# Patient Record
Sex: Male | Born: 1935 | Race: White | Hispanic: No | Marital: Single
Health system: Southern US, Community
[De-identification: ages and names within clinical notes are randomized; demographics above are authoritative.]

---

## 2008-11-23 IMAGING — CT CT CHEST W/ CM
1 series · 15 of 32 positions shown, 19 images · non-contrast
Comparison: none

REASON FOR EXAM: Weight loss, abnormal chest x-ray
COMMENTS:

[Series 2: soft tissue · axial · 0.77mm/px · z∈[-728,-438]mm · 15 of 66 slices shown, 19 images]
[im 5/66  soft-tissue]
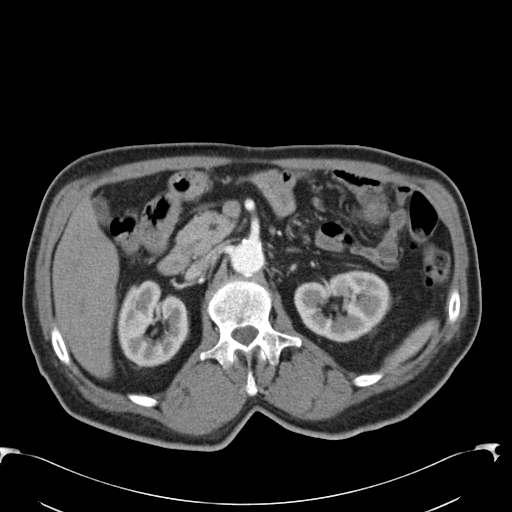
[im 5/66  bone]
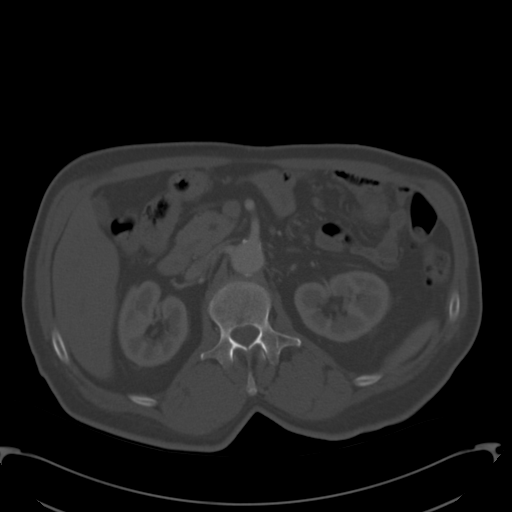
[im 9/66  soft-tissue]
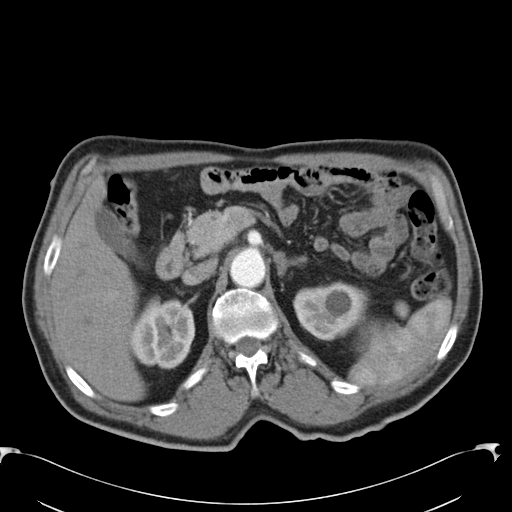
[im 13/66  soft-tissue]
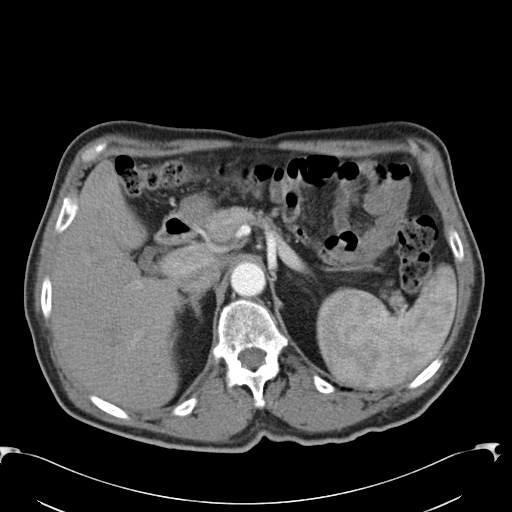
[im 19/66  soft-tissue]
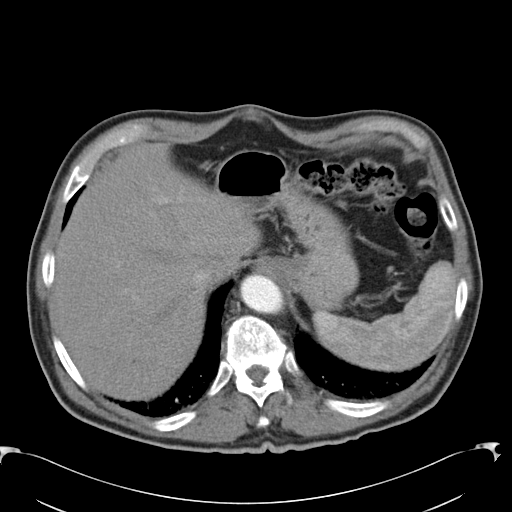
[im 24/66  soft-tissue]
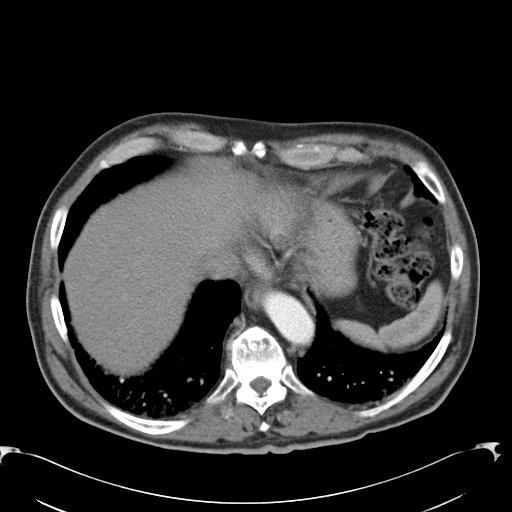
[im 28/66  soft-tissue]
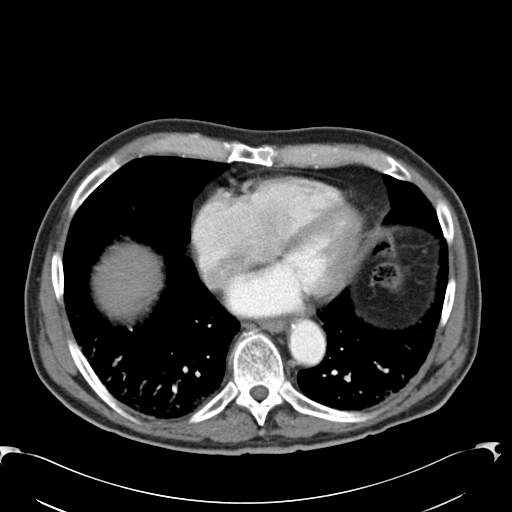
[im 34/66  soft-tissue]
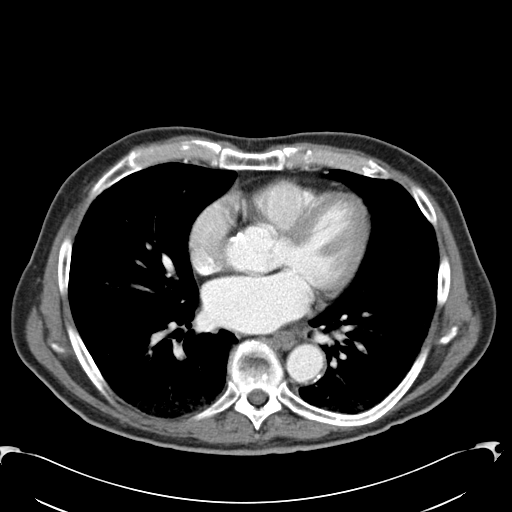
[im 38/66  soft-tissue]
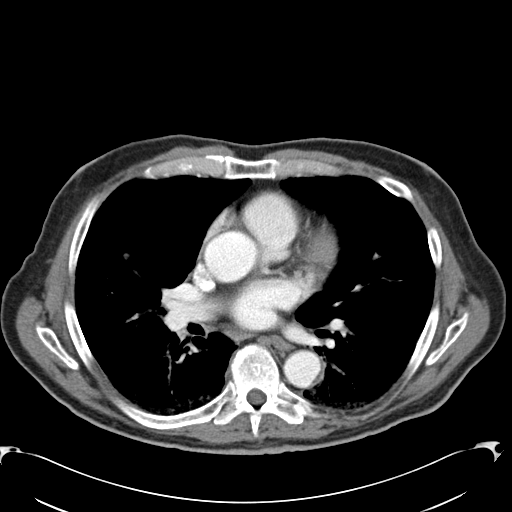
[im 42/66  soft-tissue]
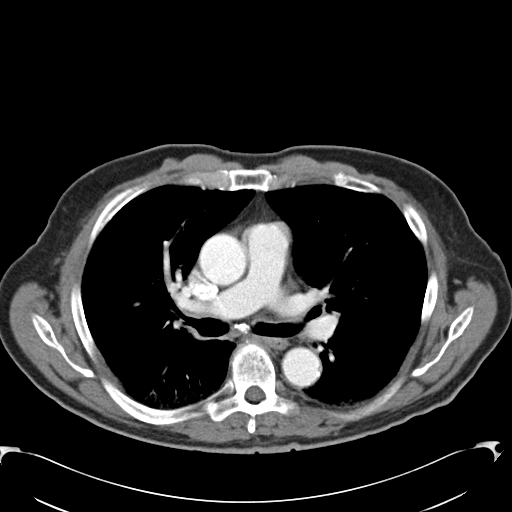
[im 42/66  bone]
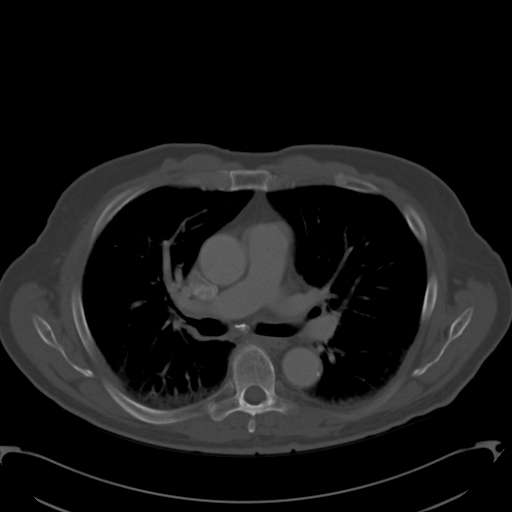
[im 47/66  soft-tissue]
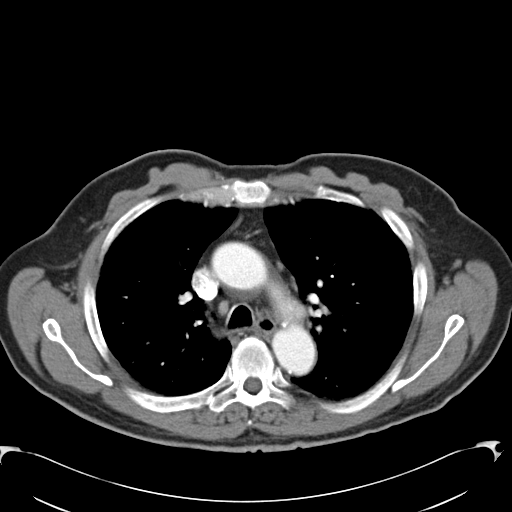
[im 53/66  soft-tissue]
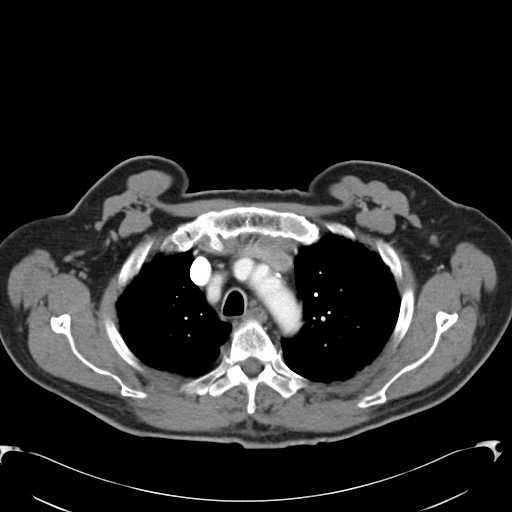
[im 57/66  soft-tissue]
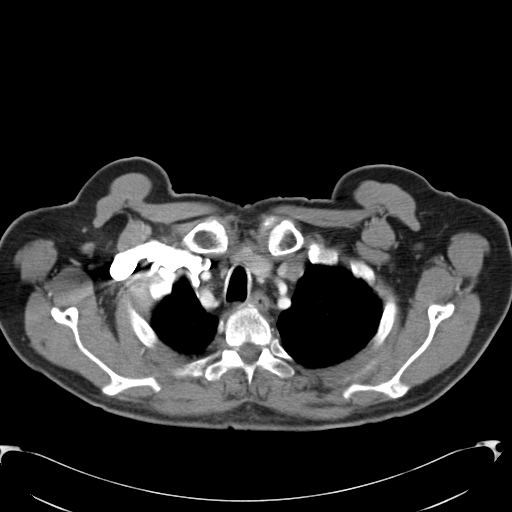
[im 57/66  lung]
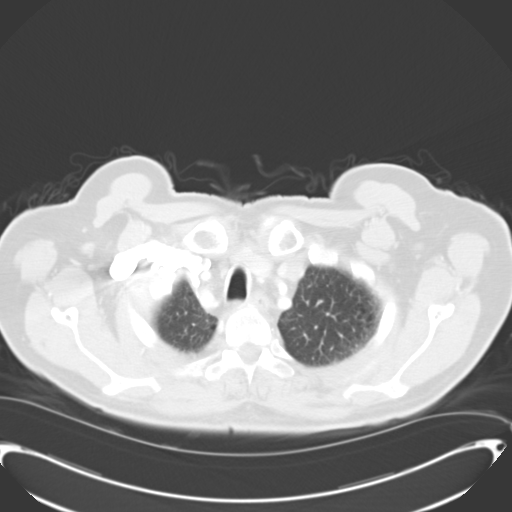
[im 59/66  lung]
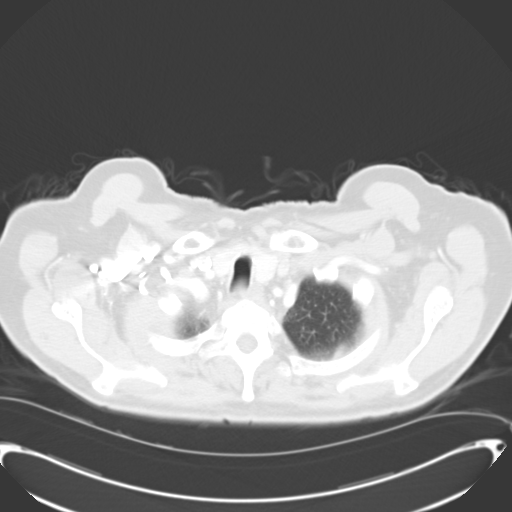
[im 61/66  soft-tissue]
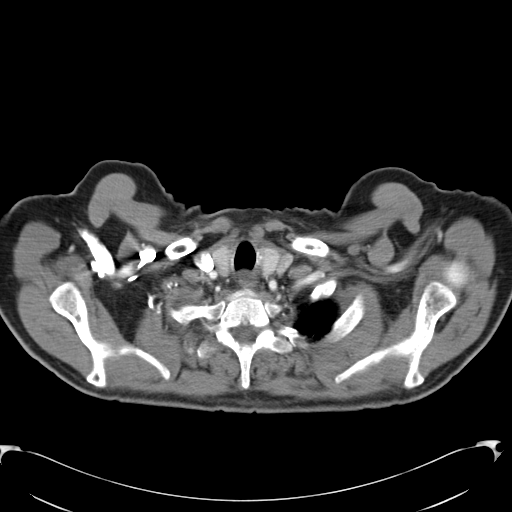
[im 61/66  lung]
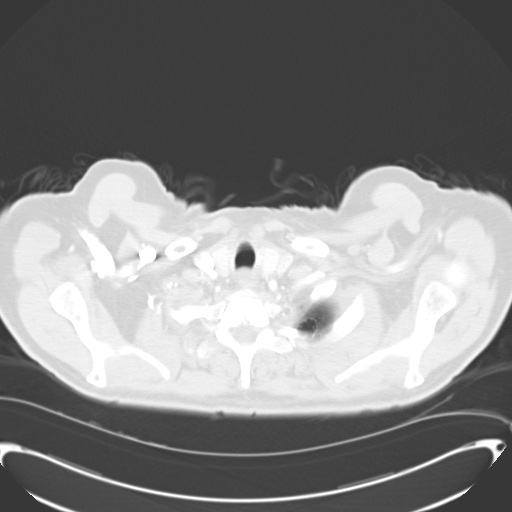
[im 63/66  lung]
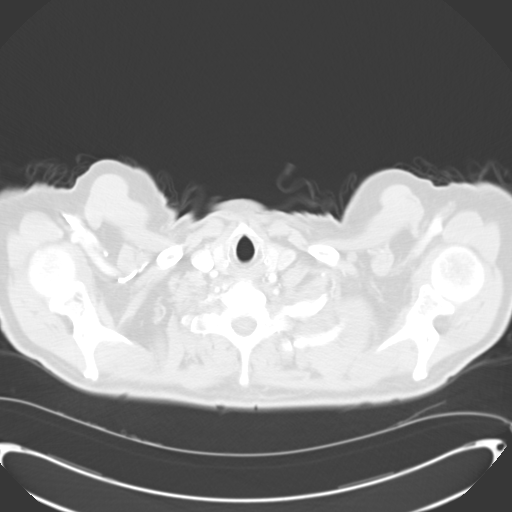

[15 of 32 positions shown; findings below may reference images not displayed]

PROCEDURE:     CT  - CT CHEST WITH CONTRAST  - June 14, 2007  [DATE]

RESULT:     The patient received 75 ml of Rsovue-21I for this study.

The cardiac chambers are normal in size. The caliber of the thoracic aorta
is normal. The central pulmonary arteries are normal in appearance. I see no
pathologic sized mediastinal or hilar lymph nodes. The retrosternal soft
tissues are normal in appearance. At lung window settings, the interstitial
markings are increased diffusely, especially in the upper lobes and in the
dependent portion of the lower lobes. I see no pulmonary parenchymal masses.
There is a 1.0 cm nodule demonstrated on image #29 in the inferior aspect of
the RIGHT upper lobe. It is noncalcified. There is a calcified nodule in the
RIGHT lower lobe posteriorly. A calcified nodule is noted more superiorly in
the RIGHT lower lobe on image #26. There are calcified, subcarinal lymph
nodes which are not enlarged. Within the upper abdomen, the observed
portions of the liver and spleen are normal. The gallbladder exhibits no
calcified stones. I see no adrenal masses. There is hypo-density in the
upper pole of the LEFT kidney which measures approximately 2.2 cm in
diameter and has Hounsfield measurement of 5.
IMPRESSION: 1.  There is an approximately 1.0 cm in diameter nodule inferiorly in the
RIGHT upper lobe on image #29. This is nonspecific and will merit follow-up
CT scanning.
2.  There are calcified nodules elsewhere in the RIGHT lower lobe consistent
with prior granulomatous infection. In addition, calcified lymph nodes in
the subcarinal region are present.
2.  There are findings consistent with COPD with an element of pulmonary
fibrosis.
3.  There is no overt evidence of CHF. The caliber of the thoracic aorta is
normal.
4.  The patient reportedly has an abnormal chest x-ray, but I do not have
the film available to me. If the dictation above does not address the
finding mentioned on the chest x-ray, I will gladly dictate an addendum.

## 2008-12-03 ENCOUNTER — Emergency Department: Payer: Self-pay | Admitting: Internal Medicine

## 2009-03-21 ENCOUNTER — Encounter: Payer: Self-pay | Admitting: Internal Medicine

## 2009-03-25 ENCOUNTER — Encounter: Payer: Self-pay | Admitting: Internal Medicine

## 2009-03-28 ENCOUNTER — Emergency Department: Payer: Self-pay | Admitting: Emergency Medicine

## 2009-04-24 ENCOUNTER — Encounter: Payer: Self-pay | Admitting: Internal Medicine

## 2009-05-25 ENCOUNTER — Encounter: Payer: Self-pay | Admitting: Internal Medicine

## 2009-06-25 ENCOUNTER — Encounter: Payer: Self-pay | Admitting: Internal Medicine

## 2009-07-25 ENCOUNTER — Encounter: Payer: Self-pay | Admitting: Internal Medicine

## 2009-08-25 ENCOUNTER — Encounter: Payer: Self-pay | Admitting: Internal Medicine

## 2009-09-24 ENCOUNTER — Encounter: Payer: Self-pay | Admitting: Internal Medicine

## 2009-10-25 ENCOUNTER — Encounter: Payer: Self-pay | Admitting: Internal Medicine

## 2009-11-25 ENCOUNTER — Encounter: Payer: Self-pay | Admitting: Internal Medicine

## 2009-12-23 ENCOUNTER — Encounter: Payer: Self-pay | Admitting: Internal Medicine

## 2010-01-23 ENCOUNTER — Encounter: Payer: Self-pay | Admitting: Internal Medicine

## 2010-02-22 ENCOUNTER — Encounter: Payer: Self-pay | Admitting: Internal Medicine

## 2010-03-25 ENCOUNTER — Encounter: Payer: Self-pay | Admitting: Internal Medicine

## 2010-04-24 ENCOUNTER — Encounter: Payer: Self-pay | Admitting: Internal Medicine

## 2010-05-15 IMAGING — CT CT HEAD WITHOUT CONTRAST
2 series · 16 of 30 positions shown, 20 images · non-contrast
Comparison: none

REASON FOR EXAM: weak
COMMENTS:

[Series 2: without · axial · non-contrast · 0.39mm/px · z∈[+695,+825]mm · 13 of 32 slices shown, 17 images]
[im 3/32  brain]
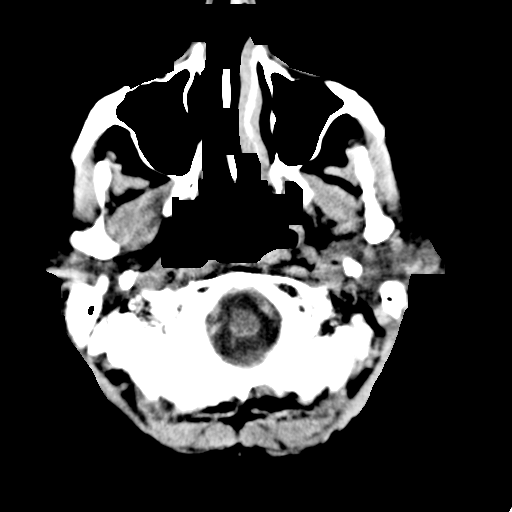
[im 3/32  bone]
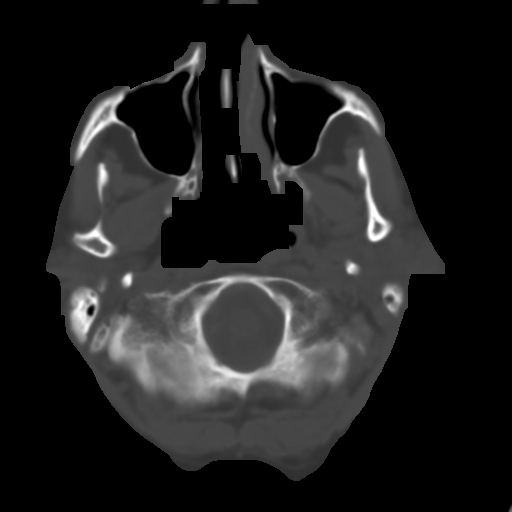
[im 5/32  brain]
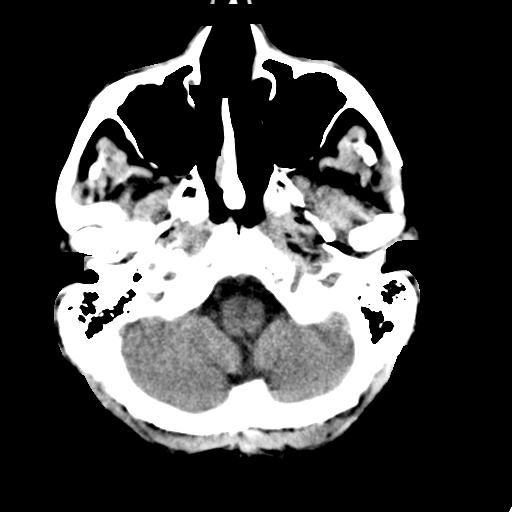
[im 7/32  brain]
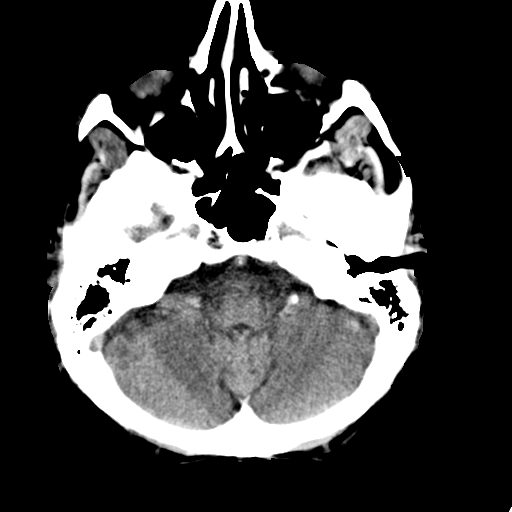
[im 9/32  brain]
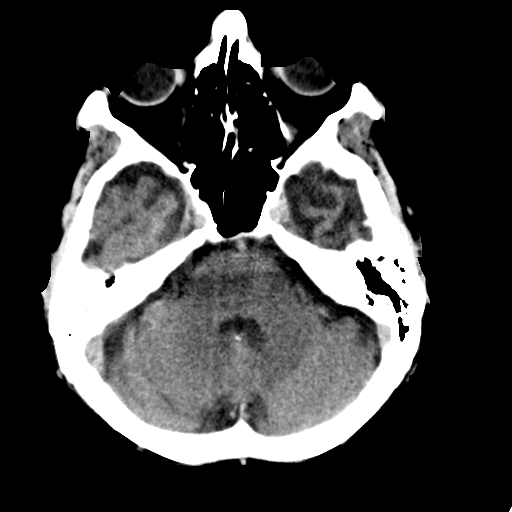
[im 12/32  brain]
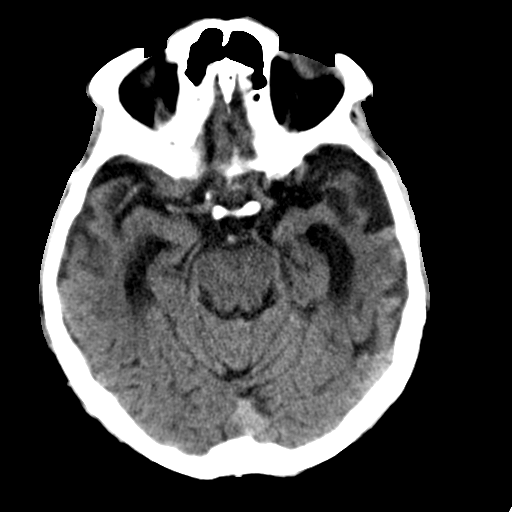
[im 12/32  bone]
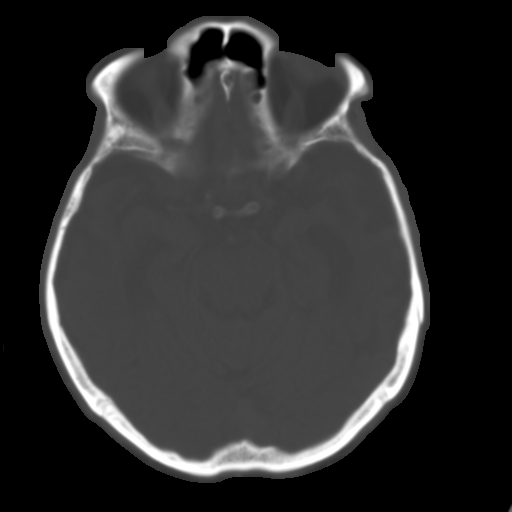
[im 14/32  brain]
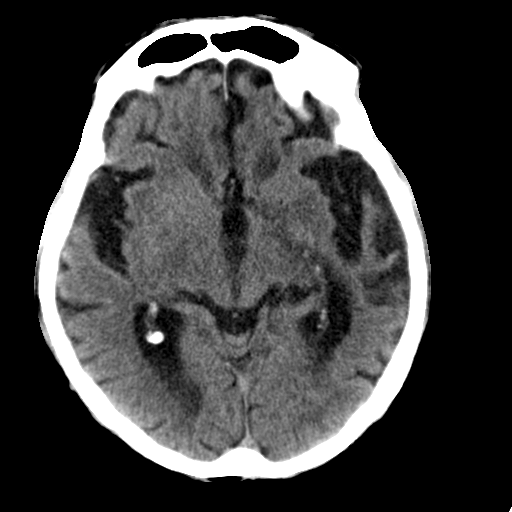
[im 16/32  brain]
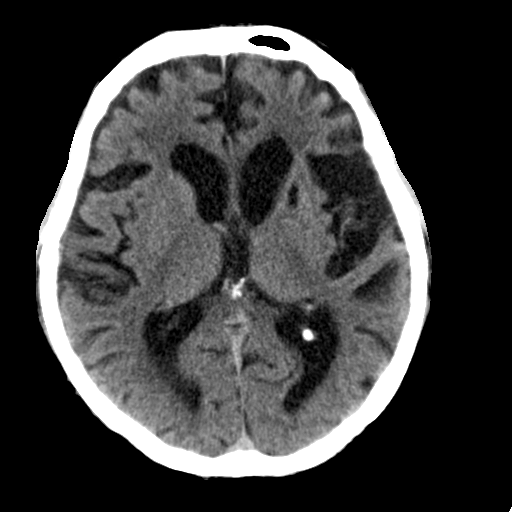
[im 18/32  brain]
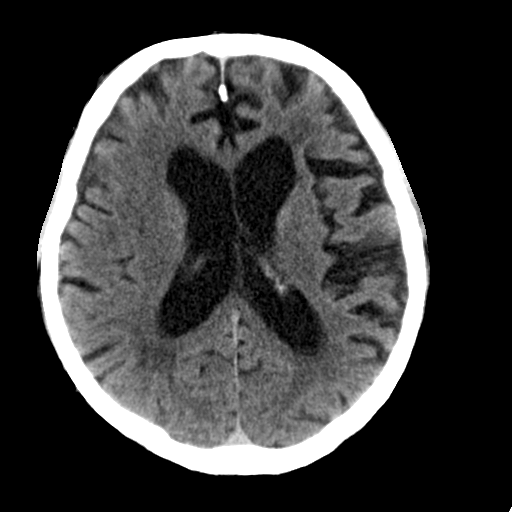
[im 20/32  brain]
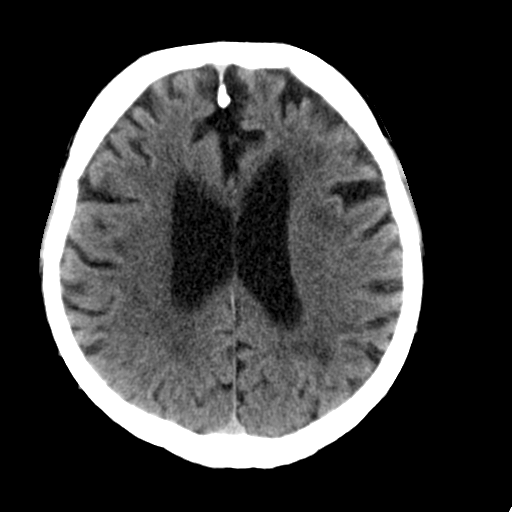
[im 20/32  bone]
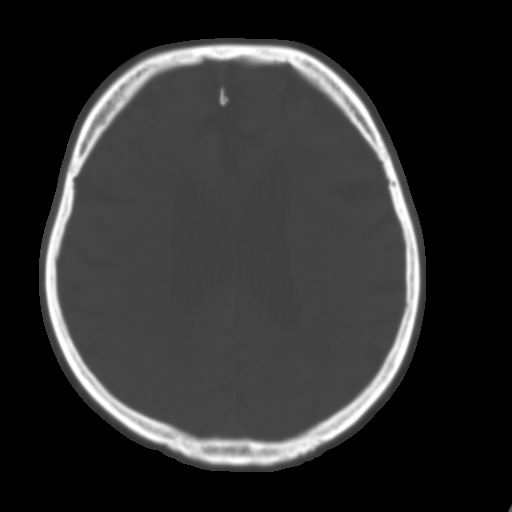
[im 23/32  brain]
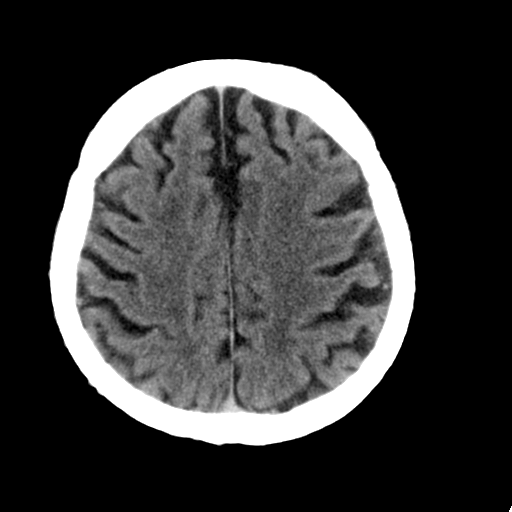
[im 25/32  brain]
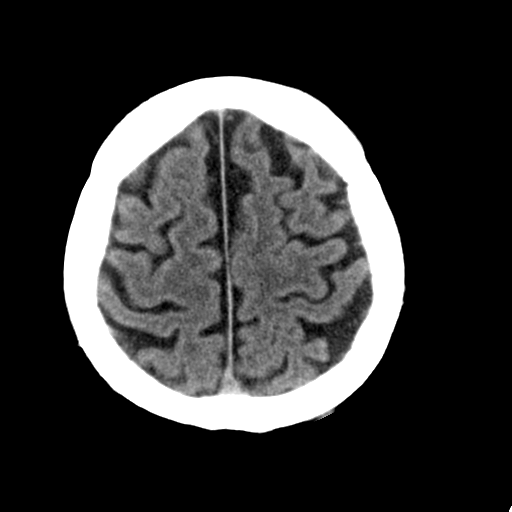
[im 27/32  brain]
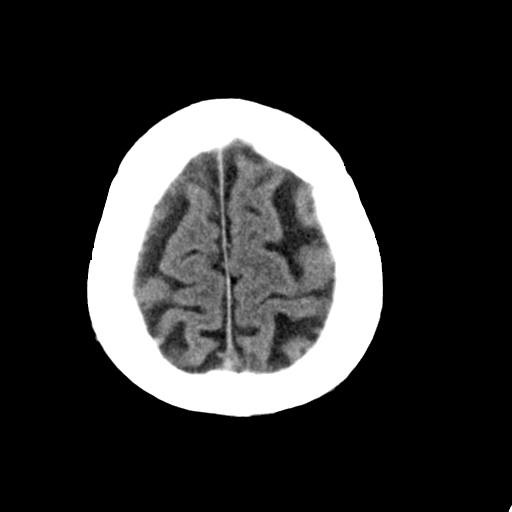
[im 29/32  brain]
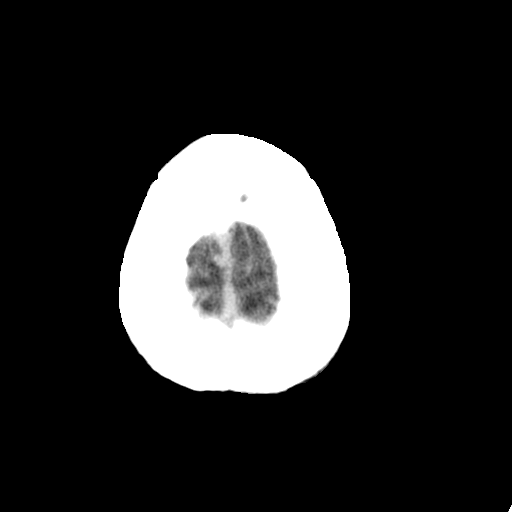
[im 29/32  bone]
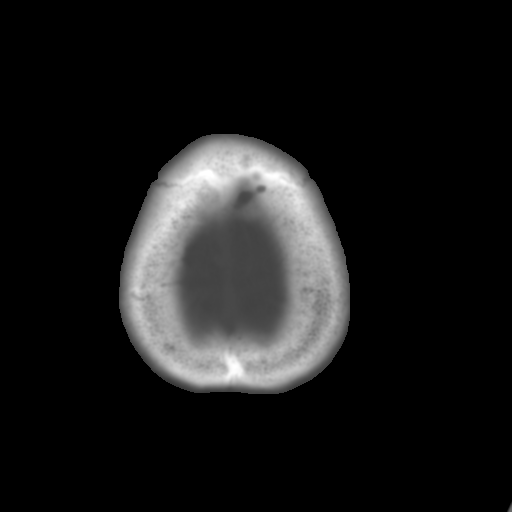

[Series 3: bone · axial · 0.39mm/px · z∈[+695,+740]mm · 3 of 32 slices shown]
[im 3/32  bone]
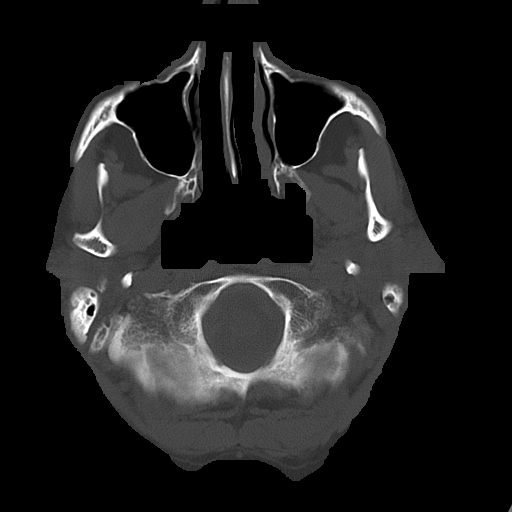
[im 7/32  bone]
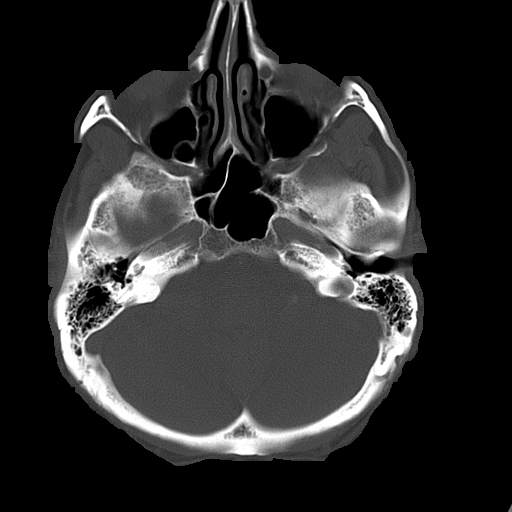
[im 12/32  bone]
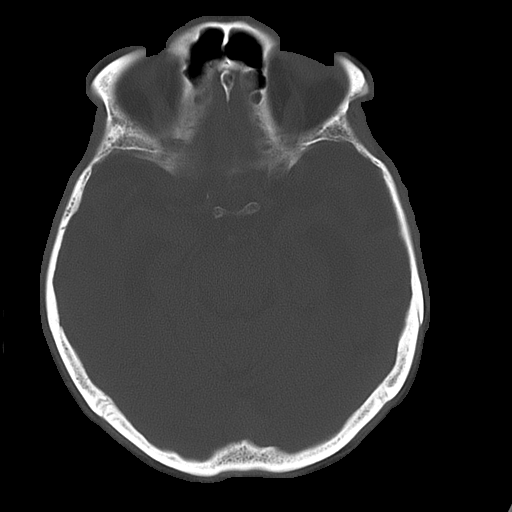

[16 of 30 positions shown; findings below may reference images not displayed]

PROCEDURE:     CT  - CT HEAD WITHOUT CONTRAST  - December 03, 2008  [DATE]

RESULT:     History: Weakness

Comparison studies: None.

Procedure and Findings: Standard nonenhanced head CT obtained. Diffuse
cerebral atrophy is present. No mass lesion noted. Old lacunar infarction
noted in the left lenticular note is. Mucous retention cyst left maxillary
sinus. No acute bony abnormalities identified
IMPRESSION: No acute abnormality. Deep white matter changes noted
consistent with chronic ischemia. Old left lacunar infarction.

## 2010-05-25 ENCOUNTER — Encounter: Payer: Self-pay | Admitting: Internal Medicine

## 2010-06-25 ENCOUNTER — Encounter: Payer: Self-pay | Admitting: Internal Medicine

## 2010-07-25 ENCOUNTER — Encounter: Payer: Self-pay | Admitting: Internal Medicine

## 2010-08-25 ENCOUNTER — Encounter: Payer: Self-pay | Admitting: Internal Medicine

## 2010-09-24 ENCOUNTER — Encounter: Payer: Self-pay | Admitting: Internal Medicine

## 2010-10-25 ENCOUNTER — Encounter: Payer: Self-pay | Admitting: Internal Medicine

## 2010-11-25 ENCOUNTER — Encounter: Payer: Self-pay | Admitting: Internal Medicine

## 2010-12-24 ENCOUNTER — Encounter: Payer: Self-pay | Admitting: Internal Medicine

## 2011-01-24 ENCOUNTER — Encounter: Payer: Self-pay | Admitting: Internal Medicine

## 2011-02-23 ENCOUNTER — Encounter: Payer: Self-pay | Admitting: Internal Medicine

## 2011-03-26 ENCOUNTER — Encounter: Payer: Self-pay | Admitting: Internal Medicine

## 2011-04-25 ENCOUNTER — Encounter: Payer: Self-pay | Admitting: Internal Medicine

## 2011-05-26 ENCOUNTER — Encounter: Payer: Self-pay | Admitting: Internal Medicine

## 2011-06-26 ENCOUNTER — Encounter: Payer: Self-pay | Admitting: Internal Medicine

## 2011-07-22 ENCOUNTER — Encounter: Payer: Self-pay | Admitting: Internal Medicine

## 2011-07-26 ENCOUNTER — Encounter: Payer: Self-pay | Admitting: Internal Medicine

## 2011-08-26 ENCOUNTER — Encounter: Payer: Self-pay | Admitting: Internal Medicine

## 2011-09-25 ENCOUNTER — Encounter: Payer: Self-pay | Admitting: Internal Medicine

## 2011-10-26 ENCOUNTER — Encounter: Payer: Self-pay | Admitting: Internal Medicine

## 2011-11-26 ENCOUNTER — Encounter: Payer: Self-pay | Admitting: Internal Medicine

## 2011-12-24 ENCOUNTER — Encounter: Payer: Self-pay | Admitting: Internal Medicine

## 2012-01-24 ENCOUNTER — Encounter: Payer: Self-pay | Admitting: Internal Medicine

## 2012-02-23 ENCOUNTER — Encounter: Payer: Self-pay | Admitting: Internal Medicine

## 2012-03-25 ENCOUNTER — Encounter: Payer: Self-pay | Admitting: Internal Medicine

## 2012-04-24 ENCOUNTER — Encounter: Payer: Self-pay | Admitting: Internal Medicine

## 2012-05-25 ENCOUNTER — Encounter: Payer: Self-pay | Admitting: Internal Medicine

## 2012-06-25 ENCOUNTER — Encounter: Payer: Self-pay | Admitting: Internal Medicine

## 2012-07-25 ENCOUNTER — Encounter: Payer: Self-pay | Admitting: Internal Medicine

## 2012-08-25 ENCOUNTER — Encounter: Payer: Self-pay | Admitting: Internal Medicine

## 2012-09-24 ENCOUNTER — Encounter: Payer: Self-pay | Admitting: Internal Medicine

## 2012-10-25 ENCOUNTER — Encounter: Payer: Self-pay | Admitting: Internal Medicine

## 2012-11-25 ENCOUNTER — Encounter: Payer: Self-pay | Admitting: Internal Medicine

## 2012-12-23 ENCOUNTER — Encounter: Payer: Self-pay | Admitting: Internal Medicine

## 2013-01-23 ENCOUNTER — Encounter: Payer: Self-pay | Admitting: Internal Medicine

## 2013-02-22 ENCOUNTER — Encounter: Payer: Self-pay | Admitting: Internal Medicine

## 2013-03-25 ENCOUNTER — Encounter: Payer: Self-pay | Admitting: Internal Medicine

## 2013-04-24 ENCOUNTER — Encounter: Payer: Self-pay | Admitting: Internal Medicine

## 2013-05-25 ENCOUNTER — Encounter: Payer: Self-pay | Admitting: Internal Medicine

## 2013-06-25 ENCOUNTER — Encounter: Payer: Self-pay | Admitting: Internal Medicine

## 2013-07-25 ENCOUNTER — Encounter: Payer: Self-pay | Admitting: Internal Medicine

## 2013-08-25 ENCOUNTER — Encounter: Payer: Self-pay | Admitting: Internal Medicine

## 2013-09-24 ENCOUNTER — Encounter: Payer: Self-pay | Admitting: Internal Medicine

## 2013-10-25 ENCOUNTER — Encounter: Payer: Self-pay | Admitting: Internal Medicine

## 2013-11-25 ENCOUNTER — Encounter: Payer: Self-pay | Admitting: Internal Medicine

## 2013-12-23 ENCOUNTER — Encounter: Payer: Self-pay | Admitting: Internal Medicine

## 2014-01-23 ENCOUNTER — Ambulatory Visit: Payer: Self-pay | Admitting: Nurse Practitioner

## 2014-01-23 ENCOUNTER — Encounter: Payer: Self-pay | Admitting: Internal Medicine

## 2014-02-22 ENCOUNTER — Encounter: Payer: Self-pay | Admitting: Internal Medicine

## 2014-03-25 ENCOUNTER — Encounter: Payer: Self-pay | Admitting: Internal Medicine

## 2014-04-24 ENCOUNTER — Encounter: Payer: Self-pay | Admitting: Internal Medicine

## 2014-05-25 ENCOUNTER — Encounter: Payer: Self-pay | Admitting: Internal Medicine

## 2014-06-25 ENCOUNTER — Encounter: Payer: Self-pay | Admitting: Internal Medicine

## 2014-07-25 ENCOUNTER — Encounter: Payer: Self-pay | Admitting: Internal Medicine

## 2014-08-25 ENCOUNTER — Encounter: Payer: Self-pay | Admitting: Internal Medicine

## 2014-09-24 ENCOUNTER — Encounter: Payer: Self-pay | Admitting: Internal Medicine

## 2014-10-25 DEATH — deceased

## 2015-01-24 ENCOUNTER — Ambulatory Visit: Payer: Self-pay | Admitting: Nurse Practitioner

## 2020-05-13 NOTE — Progress Notes (Signed)
This encounter was created in error - please disregard.
